# Patient Record
Sex: Male | Born: 2000 | Race: Black or African American | Hispanic: No | Marital: Single
Health system: Southern US, Community
[De-identification: ages and names within clinical notes are randomized; demographics above are authoritative.]

## PROBLEM LIST (undated history)

## (undated) ENCOUNTER — Emergency Department (HOSPITAL_COMMUNITY): Admission: EM | Payer: BLUE CROSS/BLUE SHIELD

## (undated) DIAGNOSIS — W3400XA Accidental discharge from unspecified firearms or gun, initial encounter: Secondary | ICD-10-CM

---
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
## 2021-03-10 IMAGING — DX DG CHEST 1V PORT
1 series · 1 of 1 positions shown · non-contrast
Comparison: 10/20/2019

CLINICAL DATA: Respiratory failure.  Central line placement.

EXAM:
PORTABLE CHEST 1 VIEW

[chest ap]
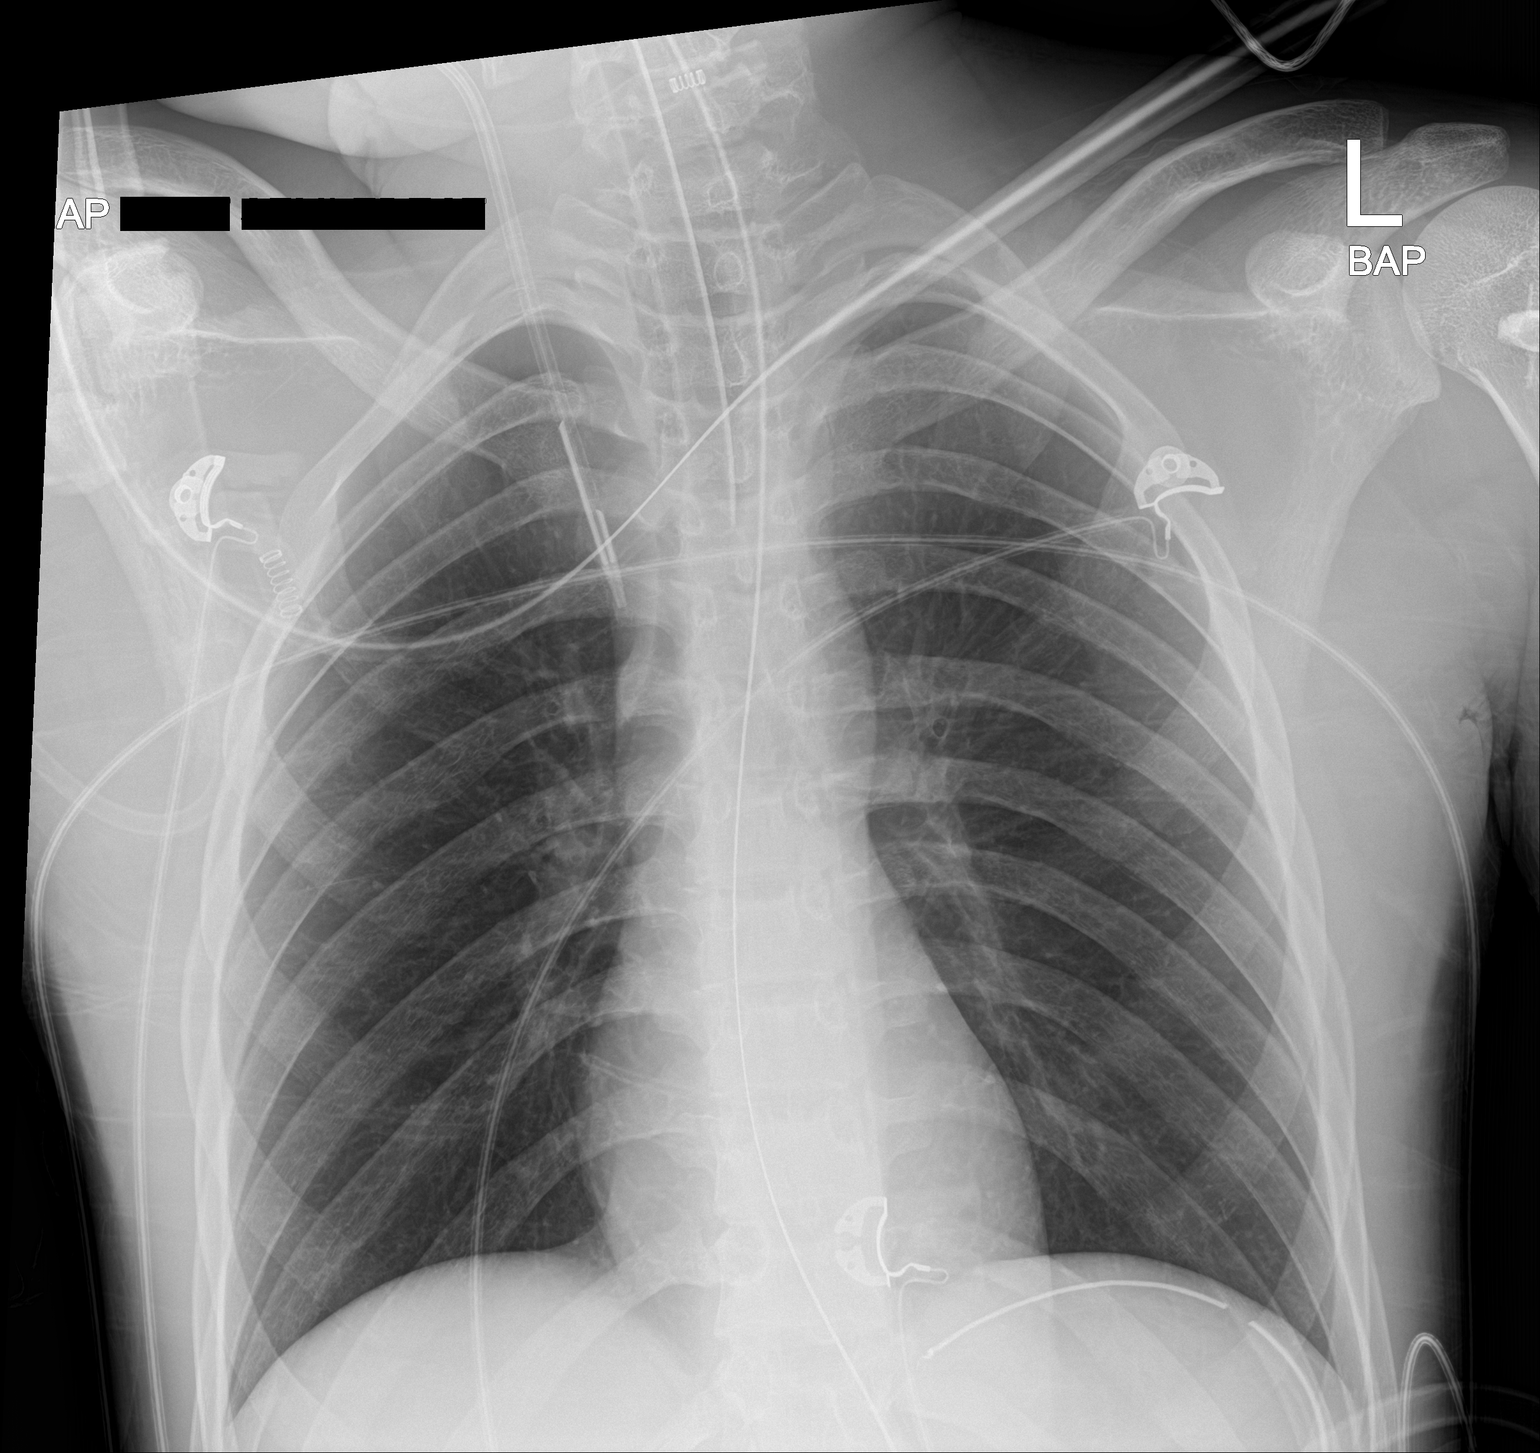

[1 of 1 positions shown; findings below may reference images not displayed]

FINDINGS: Endotracheal tube remains in good position. Right jugular central
venous catheter is in place with the tip in the innominate vein
region. No pneumothorax. NG tube has been placed and is coiled in
the fundus of the stomach.

Lungs remain clear without infiltrate or effusion.
IMPRESSION: Right jugular central venous catheter tip in the right innominate
vein. No pneumothorax.

NG coiled in the stomach.  Endotracheal tube in good position

Lungs remain clear.
